# Patient Record
Sex: Male | Born: 1975 | Race: Black or African American | Hispanic: No | Marital: Single | Smoking: Current every day smoker
Health system: Southern US, Community
[De-identification: ages and names within clinical notes are randomized; demographics above are authoritative.]

## PROBLEM LIST (undated history)

## (undated) ENCOUNTER — Emergency Department (HOSPITAL_BASED_OUTPATIENT_CLINIC_OR_DEPARTMENT_OTHER): Source: Home / Self Care

## (undated) DIAGNOSIS — J45909 Unspecified asthma, uncomplicated: Secondary | ICD-10-CM

## (undated) HISTORY — PX: OTHER SURGICAL HISTORY: SHX169

---

## 1998-12-01 ENCOUNTER — Emergency Department (HOSPITAL_COMMUNITY): Admission: EM | Admit: 1998-12-01 | Discharge: 1998-12-01 | Payer: Self-pay | Admitting: Emergency Medicine

## 1998-12-11 ENCOUNTER — Encounter: Admission: RE | Admit: 1998-12-11 | Discharge: 1998-12-11 | Payer: Self-pay | Admitting: *Deleted

## 2008-10-21 ENCOUNTER — Emergency Department (HOSPITAL_COMMUNITY): Admission: EM | Admit: 2008-10-21 | Discharge: 2008-10-21 | Payer: Self-pay | Admitting: Emergency Medicine

## 2010-04-09 ENCOUNTER — Ambulatory Visit (HOSPITAL_COMMUNITY): Admission: RE | Admit: 2010-04-09 | Discharge: 2010-04-09 | Payer: Self-pay | Admitting: Psychiatry

## 2010-04-18 ENCOUNTER — Emergency Department (HOSPITAL_COMMUNITY): Admission: EM | Admit: 2010-04-18 | Discharge: 2010-04-18 | Payer: Self-pay | Admitting: Emergency Medicine

## 2010-11-19 LAB — URINALYSIS, ROUTINE W REFLEX MICROSCOPIC
Glucose, UA: NEGATIVE mg/dL
Hgb urine dipstick: NEGATIVE
Protein, ur: NEGATIVE mg/dL
Specific Gravity, Urine: 1.02 (ref 1.005–1.030)
Urobilinogen, UA: 0.2 mg/dL (ref 0.0–1.0)

## 2017-05-19 ENCOUNTER — Ambulatory Visit (HOSPITAL_COMMUNITY)
Admission: EM | Admit: 2017-05-19 | Discharge: 2017-05-19 | Disposition: A | Discharge status: Home / Self Care | Payer: BLUE CROSS/BLUE SHIELD | Attending: Family Medicine | Admitting: Family Medicine

## 2017-05-19 ENCOUNTER — Encounter (HOSPITAL_COMMUNITY): Payer: Self-pay | Admitting: Emergency Medicine

## 2017-05-19 DIAGNOSIS — J40 Bronchitis, not specified as acute or chronic: Secondary | ICD-10-CM

## 2017-05-19 DIAGNOSIS — J040 Acute laryngitis: Secondary | ICD-10-CM

## 2017-05-19 MED ORDER — METHYLPREDNISOLONE SODIUM SUCC 125 MG IJ SOLR
INTRAMUSCULAR | Status: AC
  Filled 2017-05-19: qty 2

## 2017-05-19 MED ORDER — AEROCHAMBER PLUS FLO-VU LARGE MISC: 1.0000 | Freq: Once | 0 refills | Status: AC

## 2017-05-19 MED ORDER — IPRATROPIUM-ALBUTEROL 0.5-2.5 (3) MG/3ML IN SOLN
3.0000 mL | Freq: Once | RESPIRATORY_TRACT | Status: AC
  Administered 2017-05-19: 3 mL via RESPIRATORY_TRACT

## 2017-05-19 MED ORDER — BENZONATATE 100 MG PO CAPS: 100.0000 mg | ORAL_CAPSULE | Freq: Three times a day (TID) | ORAL | 0 refills | Status: DC

## 2017-05-19 MED ORDER — PREDNISONE 10 MG (21) PO TBPK: ORAL_TABLET | Freq: Every day | ORAL | 0 refills | Status: DC

## 2017-05-19 MED ORDER — METHYLPREDNISOLONE SODIUM SUCC 125 MG IJ SOLR
125.0000 mg | Freq: Once | INTRAMUSCULAR | Status: AC
  Administered 2017-05-19: 125 mg via INTRAMUSCULAR

## 2017-05-19 MED ORDER — IPRATROPIUM-ALBUTEROL 0.5-2.5 (3) MG/3ML IN SOLN
RESPIRATORY_TRACT | Status: AC
  Filled 2017-05-19: qty 3

## 2017-05-19 MED ORDER — ALBUTEROL SULFATE HFA 108 (90 BASE) MCG/ACT IN AERS: 1.0000 | INHALATION_SPRAY | Freq: Four times a day (QID) | RESPIRATORY_TRACT | 0 refills | Status: DC | PRN

## 2017-05-19 MED ORDER — AZITHROMYCIN 250 MG PO TABS: 250.0000 mg | ORAL_TABLET | Freq: Every day | ORAL | 0 refills | Status: DC

## 2017-05-19 NOTE — ED Provider Notes (Signed)
[facility]    CSN: [id number] Arrival date & time: 05/19/17  1138     History   Chief Complaint Chief Complaint  Patient presents with  . URI    HPI Roy Sanchez is a 41 y.o. male.   41 year old male comes in for 1 week history of cough. He states he has had some shortness of breath/wheezing and associated chest pain during cough. He started experiencing hoarseness 2-3 days ago, stating he thinks it is due to cough and going in and out cold environments. Denies fever, chills, night sweats. Denies nasal congestion, rhinorrhea, eye pain. Has has some ear fullness. He has not tried anything for his symptoms. He is an every day smoker for 25 years, estimated 25 pack year history. He has tried to continue smoking, stating he just will not inhale. He has not needed to use inhalers in the past.       History reviewed. No pertinent past medical history.  There are no active problems to display for this patient.   History reviewed. No pertinent surgical history.     Home Medications    Prior to Admission medications   Medication Sig Start Date End Date Taking? Authorizing Provider  albuterol (PROVENTIL HFA;VENTOLIN HFA) 108 (90 Base) MCG/ACT inhaler Inhale 1-2 puffs into the lungs every 6 (six) hours as needed for wheezing or shortness of breath. 05/19/17   Cathie Hoops, Amy V, PA-C  azithromycin (ZITHROMAX) 250 MG tablet Take 1 tablet (250 mg total) by mouth daily. Take first 2 tablets together, then 1 every day until finished. 05/19/17   Cathie Hoops, Amy V, PA-C  benzonatate (TESSALON) 100 MG capsule Take 1 capsule (100 mg total) by mouth every 8 (eight) hours. 05/19/17   Cathie Hoops, Amy V, PA-C  predniSONE (STERAPRED UNI-PAK 21 TAB) 10 MG (21) TBPK tablet Take by mouth daily. Take 6 tabs by mouth day 1, then 5 tabs, then 4 tabs, then 3 tabs, 2 tabs, then 1 tab for the last day 05/19/17   Belinda Fisher, PA-C  Spacer/Aero-Holding Chambers (AEROCHAMBER PLUS FLO-VU LARGE) MISC 1 each by Other route once.  05/19/17 05/19/17  Belinda Fisher, PA-C    Family History History reviewed. No pertinent family history.  Social History Social History  Substance Use Topics  . Smoking status: Current Every Day Smoker    Packs/day: 1.00    Types: Cigarettes  . Smokeless tobacco: Never Used  . Alcohol use Yes     Allergies   Patient has no known allergies.   Review of Systems Review of Systems  Reason unable to perform ROS: See HPI as above.     Physical Exam Triage Vital Signs ED Triage Vitals [05/19/17 1305]  Enc Vitals Group     BP (!) 143/97     Pulse Rate 67     Resp 20     Temp 98.4 F (36.9 C)     Temp Source Oral     SpO2 96 %     Weight      Height      Head Circumference      Peak Flow      Pain Score      Pain Loc      Pain Edu?      Excl. in GC?    No data found.   Updated Vital Signs BP (!) 143/97 (BP Location: Left Arm)   Pulse 67   Temp 98.4 F (36.9 C) (Oral)   Resp 20  SpO2 96%    Physical Exam  Constitutional: He is oriented to person, place, and time. He appears well-developed and well-nourished. No distress.  HENT:  Head: Normocephalic and atraumatic.  Right Ear: Tympanic membrane, external ear and ear canal normal. Tympanic membrane is not erythematous and not bulging.  Left Ear: Tympanic membrane, external ear and ear canal normal. Tympanic membrane is not erythematous and not bulging.  Nose: Nose normal. Right sinus exhibits no maxillary sinus tenderness and no frontal sinus tenderness. Left sinus exhibits no maxillary sinus tenderness and no frontal sinus tenderness.  Mouth/Throat: Uvula is midline and mucous membranes are normal. Posterior oropharyngeal erythema present.  Eyes: Pupils are equal, round, and reactive to light. Conjunctivae are normal.  Neck: Normal range of motion. Neck supple.  Cardiovascular: Normal rate, regular rhythm and normal heart sounds.  Exam reveals no gallop and no friction rub.   No murmur heard. Pulmonary/Chest:  Effort normal.  Inspiratory and expiratory wheezing, decreased air movement.  Lymphadenopathy:    He has no cervical adenopathy.  Neurological: He is alert and oriented to person, place, and time.  Skin: Skin is warm and dry.  Psychiatric: He has a normal mood and affect. His behavior is normal. Judgment normal.     UC Treatments / Results  Labs (all labs ordered are listed, but only abnormal results are displayed) Labs Reviewed - No data to display  EKG  EKG Interpretation None       Radiology No results found.  Procedures Procedures (including critical care time)  Medications Ordered in UC Medications  ipratropium-albuterol (DUONEB) 0.5-2.5 (3) MG/3ML nebulizer solution 3 mL (3 mLs Nebulization Given 05/19/17 1350)  methylPREDNISolone sodium succinate (SOLU-MEDROL) 125 mg/2 mL injection 125 mg (125 mg Intramuscular Given 05/19/17 1350)     Initial Impression / Assessment and Plan / UC Course  I have reviewed the triage vital signs and the nursing notes.  Pertinent labs & imaging results that were available during my care of the patient were reviewed by me and considered in my medical decision making (see chart for details).    Improved wheezing after duoneb treatment. Given smoking history, will treat as COPD exacerbation. Start prednisone and azithromycin. Albuterol with spacer PRN. Other symptomatic treatment discussed. Encouraged smoking cessation. Patient to establish PCP for further treatment and evaluation needed. Return precautions given.   Final Clinical Impressions(s) / UC Diagnoses   Final diagnoses:  Bronchitis  Laryngitis    New Prescriptions New Prescriptions   ALBUTEROL (PROVENTIL HFA;VENTOLIN HFA) 108 (90 BASE) MCG/ACT INHALER    Inhale 1-2 puffs into the lungs every 6 (six) hours as needed for wheezing or shortness of breath.   AZITHROMYCIN (ZITHROMAX) 250 MG TABLET    Take 1 tablet (250 mg total) by mouth daily. Take first 2 tablets together, then  1 every day until finished.   BENZONATATE (TESSALON) 100 MG CAPSULE    Take 1 capsule (100 mg total) by mouth every 8 (eight) hours.   PREDNISONE (STERAPRED UNI-PAK 21 TAB) 10 MG (21) TBPK TABLET    Take by mouth daily. Take 6 tabs by mouth day 1, then 5 tabs, then 4 tabs, then 3 tabs, 2 tabs, then 1 tab for the last day   SPACER/AERO-HOLDING CHAMBERS (AEROCHAMBER PLUS FLO-VU LARGE) MISC    1 each by Other route once.       Belinda Fisher, PA-C 05/19/17 1414

## 2017-05-19 NOTE — ED Triage Notes (Signed)
Here for cold sx onset 1 week associated laryngitis and a dry cough  Smokes 1 PPD   A&O x4.... NAD... Ambulatory

## 2017-05-19 NOTE — Discharge Instructions (Signed)
Tessalon for cough. Start prednisone, azithromycin as directed. Albuterol inhaler with spacer for wheezing, I have attached inhaler instructions. You can use flonase, allergy medicine for nasal congestion. You can also use over the counter nasal saline rinse such as neti pot for nasal congestion. Keep hydrated, your urine should be clear to pale yellow in color. Tylenol/motrin for fever and pain. Encourage smoking cessation. Establish PCP care for further evaluation and treatment needed. Monitor for any worsening of symptoms, chest pain, shortness of breath, wheezing, swelling of the throat, follow up for reevaluation.

## 2017-05-27 ENCOUNTER — Emergency Department (HOSPITAL_COMMUNITY)
Admission: EM | Admit: 2017-05-27 | Discharge: 2017-05-28 | Disposition: A | Discharge status: Home / Self Care | Payer: BLUE CROSS/BLUE SHIELD | Attending: Emergency Medicine | Admitting: Emergency Medicine

## 2017-05-27 ENCOUNTER — Emergency Department (HOSPITAL_COMMUNITY): Payer: BLUE CROSS/BLUE SHIELD

## 2017-05-27 ENCOUNTER — Encounter (HOSPITAL_COMMUNITY): Payer: Self-pay | Admitting: Emergency Medicine

## 2017-05-27 DIAGNOSIS — B9789 Other viral agents as the cause of diseases classified elsewhere: Secondary | ICD-10-CM | POA: Insufficient documentation

## 2017-05-27 DIAGNOSIS — J45909 Unspecified asthma, uncomplicated: Secondary | ICD-10-CM | POA: Diagnosis not present

## 2017-05-27 DIAGNOSIS — J069 Acute upper respiratory infection, unspecified: Secondary | ICD-10-CM | POA: Insufficient documentation

## 2017-05-27 DIAGNOSIS — R05 Cough: Secondary | ICD-10-CM | POA: Diagnosis present

## 2017-05-27 DIAGNOSIS — F17210 Nicotine dependence, cigarettes, uncomplicated: Secondary | ICD-10-CM | POA: Insufficient documentation

## 2017-05-27 HISTORY — DX: Unspecified asthma, uncomplicated: J45.909

## 2017-05-27 LAB — CBC
HEMATOCRIT: 49.7 % (ref 39.0–52.0)
Hemoglobin: 16.6 g/dL (ref 13.0–17.0)
MCH: 29.1 pg (ref 26.0–34.0)
MCHC: 33.4 g/dL (ref 30.0–36.0)
MCV: 87.2 fL (ref 78.0–100.0)
Platelets: 193 10*3/uL (ref 150–400)
RBC: 5.7 MIL/uL (ref 4.22–5.81)
RDW: 13.8 % (ref 11.5–15.5)
WBC: 11.4 10*3/uL — ABNORMAL HIGH (ref 4.0–10.5)

## 2017-05-27 LAB — BASIC METABOLIC PANEL
Anion gap: 11 (ref 5–15)
BUN: 9 mg/dL (ref 6–20)
CHLORIDE: 102 mmol/L (ref 101–111)
CO2: 22 mmol/L (ref 22–32)
CREATININE: 0.96 mg/dL (ref 0.61–1.24)
Calcium: 9.3 mg/dL (ref 8.9–10.3)
GFR calc Af Amer: >60 mL/min (ref >60)
GFR calc non Af Amer: >60 mL/min (ref >60)
GLUCOSE: 152 mg/dL — AB (ref 65–99)
POTASSIUM: 3.8 mmol/L (ref 3.5–5.1)
Sodium: 135 mmol/L (ref 135–145)

## 2017-05-27 LAB — I-STAT TROPONIN, ED: Troponin i, poc: 0.01 ng/mL (ref 0.00–0.08)

## 2017-05-27 NOTE — ED Triage Notes (Signed)
Pt c/o chest pain and dry cough x 2 weeks, pain worse with coughing. Denies n/v/shortness of breath/diaphoresis.

## 2017-05-28 MED ORDER — SODIUM CHLORIDE 0.9 % IV BOLUS (SEPSIS)
1000.0000 mL | Freq: Once | INTRAVENOUS | Status: AC
  Administered 2017-05-28: 1000 mL via INTRAVENOUS

## 2017-05-28 NOTE — ED Provider Notes (Signed)
[facility] Note   CSN: [id number] Arrival date & time: 05/27/17  1833     History   Chief Complaint Chief Complaint  Patient presents with  . Chest Pain    HPI Roy Sanchez is a 41 y.o. male.  The history is provided by the patient.  he has been sick for about the last 10 days with cough. He went to urgent care 9 days ago and was treated with prednisone, azithromycin, benzonatate, and albuterol inhaler. He states he is somewhat better, but not as good as what he thinks he should be. There is vague discomfort in his central chest which is worse after eating. He is worried that he was coughing hard enough that he may have ruptured something. He denies fever, chills, sweats. He denies arthralgias or myalgias. There has been no nausea or vomiting. He initially had some dyspnea, but that has resolved. Today, he had one episode where he got dizzy when he stood up.  Past Medical History:  Diagnosis Date  . Asthma     There are no active problems to display for this patient.   Past Surgical History:  Procedure Laterality Date  . knee surgery         Home Medications    Prior to Admission medications   Medication Sig Start Date End Date Taking? Authorizing Provider  albuterol (PROVENTIL HFA;VENTOLIN HFA) 108 (90 Base) MCG/ACT inhaler Inhale 1-2 puffs into the lungs every 6 (six) hours as needed for wheezing or shortness of breath. 05/19/17   Cathie Hoops, Amy V, PA-C  azithromycin (ZITHROMAX) 250 MG tablet Take 1 tablet (250 mg total) by mouth daily. Take first 2 tablets together, then 1 every day until finished. 05/19/17   Cathie Hoops, Amy V, PA-C  benzonatate (TESSALON) 100 MG capsule Take 1 capsule (100 mg total) by mouth every 8 (eight) hours. 05/19/17   Cathie Hoops, Amy V, PA-C  predniSONE (STERAPRED UNI-PAK 21 TAB) 10 MG (21) TBPK tablet Take by mouth daily. Take 6 tabs by mouth day 1, then 5 tabs, then 4 tabs, then 3 tabs, 2 tabs, then 1 tab for the last day 05/19/17   Belinda Fisher, PA-C      Family History No family history on file.  Social History Social History  Substance Use Topics  . Smoking status: Current Every Day Smoker    Packs/day: 1.00    Types: Cigarettes  . Smokeless tobacco: Never Used  . Alcohol use Yes     Allergies   Patient has no known allergies.   Review of Systems Review of Systems  All other systems reviewed and are negative.    Physical Exam Updated Vital Signs BP 133/86 (BP Location: Left Arm)   Pulse (!) 105   Temp 98 F (36.7 C) (Oral)   Resp 16   SpO2 98%   Physical Exam  Nursing note and vitals reviewed.  41 year old male, resting comfortably and in no acute distress. Vital signs are significant for borderline tachycardia. Oxygen saturation is 98%, which is normal. Head is normocephalic and atraumatic. PERRLA, EOMI. Oropharynx is clear. Neck is nontender and supple without adenopathy or JVD. Back is nontender and there is no CVA tenderness. Lungs are clear without rales, wheezes, or rhonchi. Chest is nontender. Heart has regular rate and rhythm without murmur. Abdomen is soft, flat, nontender without masses or hepatosplenomegaly and peristalsis is normoactive. Extremities have no cyanosis or edema, full range of motion is present. Skin is warm and dry without  rash. Neurologic: Mental status is normal, cranial nerves are intact, there are no motor or sensory deficits.  ED Treatments / Results  Labs (all labs ordered are listed, but only abnormal results are displayed) Labs Reviewed  BASIC METABOLIC PANEL - Abnormal; Notable for the following:       Result Value   Glucose, Bld 152 (*)    All other components within normal limits  CBC - Abnormal; Notable for the following:    WBC 11.4 (*)    All other components within normal limits  I-STAT TROPONIN, ED  I-STAT TROPONIN, ED    EKG  EKG Interpretation  Date/Time:  Saturday May 27 2017 18:37:52 EDT Ventricular Rate:  100 PR Interval:  134 QRS  Duration: 92 QT Interval:  370 QTC Calculation: 477 R Axis:   76 Text Interpretation:  Normal sinus rhythm Minimal voltage criteria for LVH, may be normal variant T wave abnormality, consider inferolateral ischemia Prolonged QT Abnormal ECG No old tracing to compare Confirmed by Dione Booze ([id number]) on 05/27/2017 11:57:11 PM       Radiology Dg Chest 2 View  Result Date: 05/27/2017 CLINICAL DATA:  41 y/o  M; left-sided chest pain and cough. EXAM: CHEST  2 VIEW COMPARISON:  None. FINDINGS: The heart size and mediastinal contours are within normal limits. Both lungs are clear. The visualized skeletal structures are unremarkable. IMPRESSION: No active cardiopulmonary disease. Electronically Signed   By: Mitzi Hansen M.D.   On: 05/27/2017 19:34    Procedures Procedures (including critical care time)  Medications Ordered in ED Medications  sodium chloride 0.9 % bolus 1,000 mL (0 mLs Intravenous Stopped 05/28/17 0232)     Initial Impression / Assessment and Plan / ED Course  I have reviewed the triage vital signs and the nursing notes.  Pertinent labs & imaging results that were available during my care of the patient were reviewed by me and considered in my medical decision making (see chart for details).  Respiratory tract infection. Possible episode of orthostasis. In the ED, no orthostatic vital signs changes were noted. Old records reviewed confirming recent urgent care visit for respiratory tract infection. Laboratory evaluation is significant for glucose 152. He does have a plain sister who has diabetes, so we'll need to be watched for this closely. Chest x-ray shows no acute process. ECG is borderline, but symptoms are not suggestive of ACS. We'll check repeat troponin. He will be given IV hydration and reassessed.  He feels much better after above noted treatment. At this point, I do not feel he needs any new medications. He does need to have his blood glucose rechecked in  the near future. Importance of following up with this is explained to patient, as well as the importance of smoking cessation. He is given resources to try to establish primary care.  Final Clinical Impressions(s) / ED Diagnoses   Final diagnoses:  Viral upper respiratory tract infection with cough    New Prescriptions New Prescriptions   No medications on file     Dione Booze, MD 05/28/17 [phone]

## 2017-05-28 NOTE — Discharge Instructions (Signed)
Your blood sugar today was a little high - 152. This needs to be repeated in the next 1-2 weeks. If it stays high, it would mean that you are developing diabetes. However, one of the medications you were prescribed last week can cause your blood sugar to go up. Even if your blood sugar goes back to normal, you should have it checked periodically.  Please stop smoking.  You need to get a primary care provider who can follow your blood sugars, and do routine health screening.

## 2018-04-27 IMAGING — DX DG CHEST 2V
2 series · 2 of 2 positions shown · non-contrast
Comparison: None.

CLINICAL DATA: 41 y/o  M; left-sided chest pain and cough.

EXAM:
CHEST  2 VIEW

[chest pa]
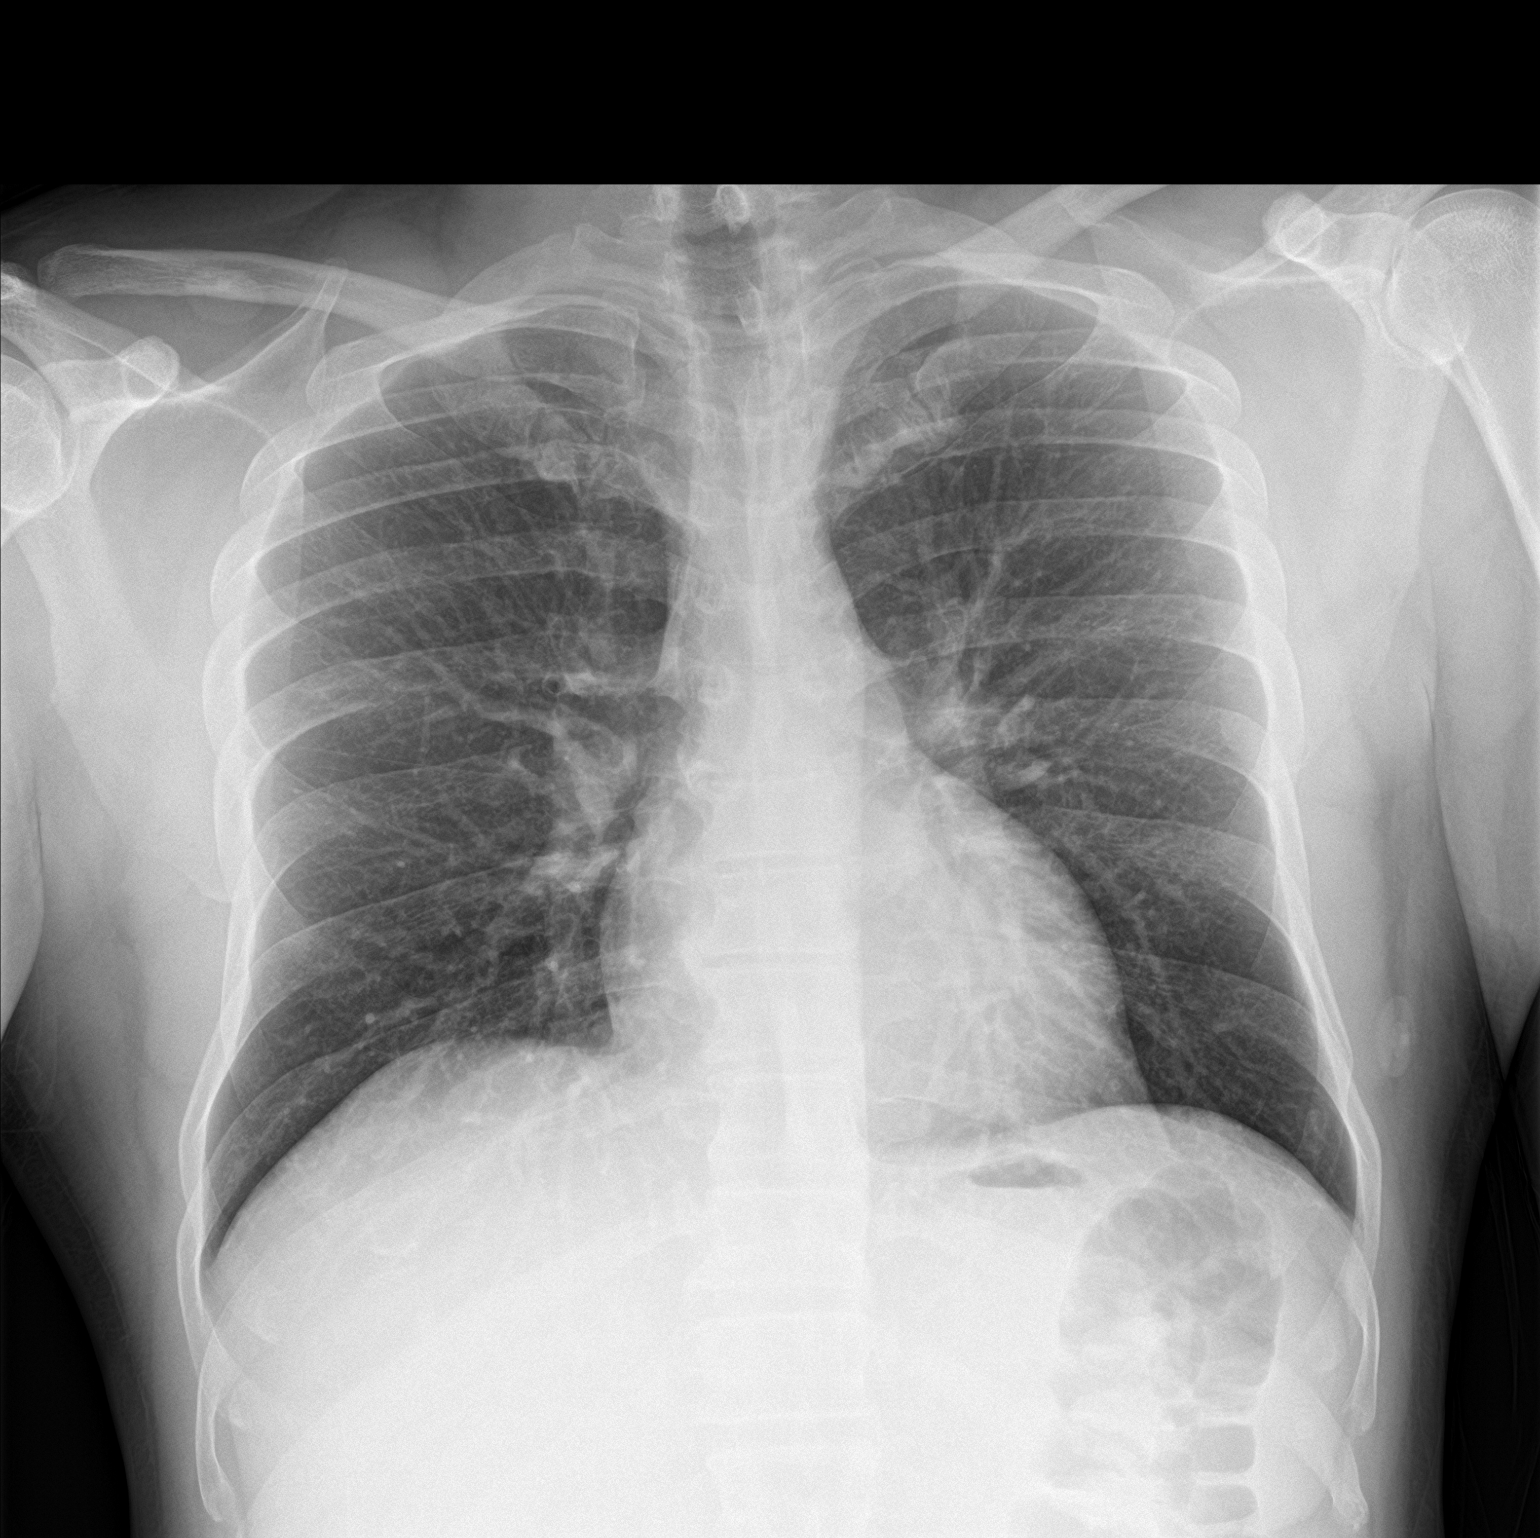

[chest lat]
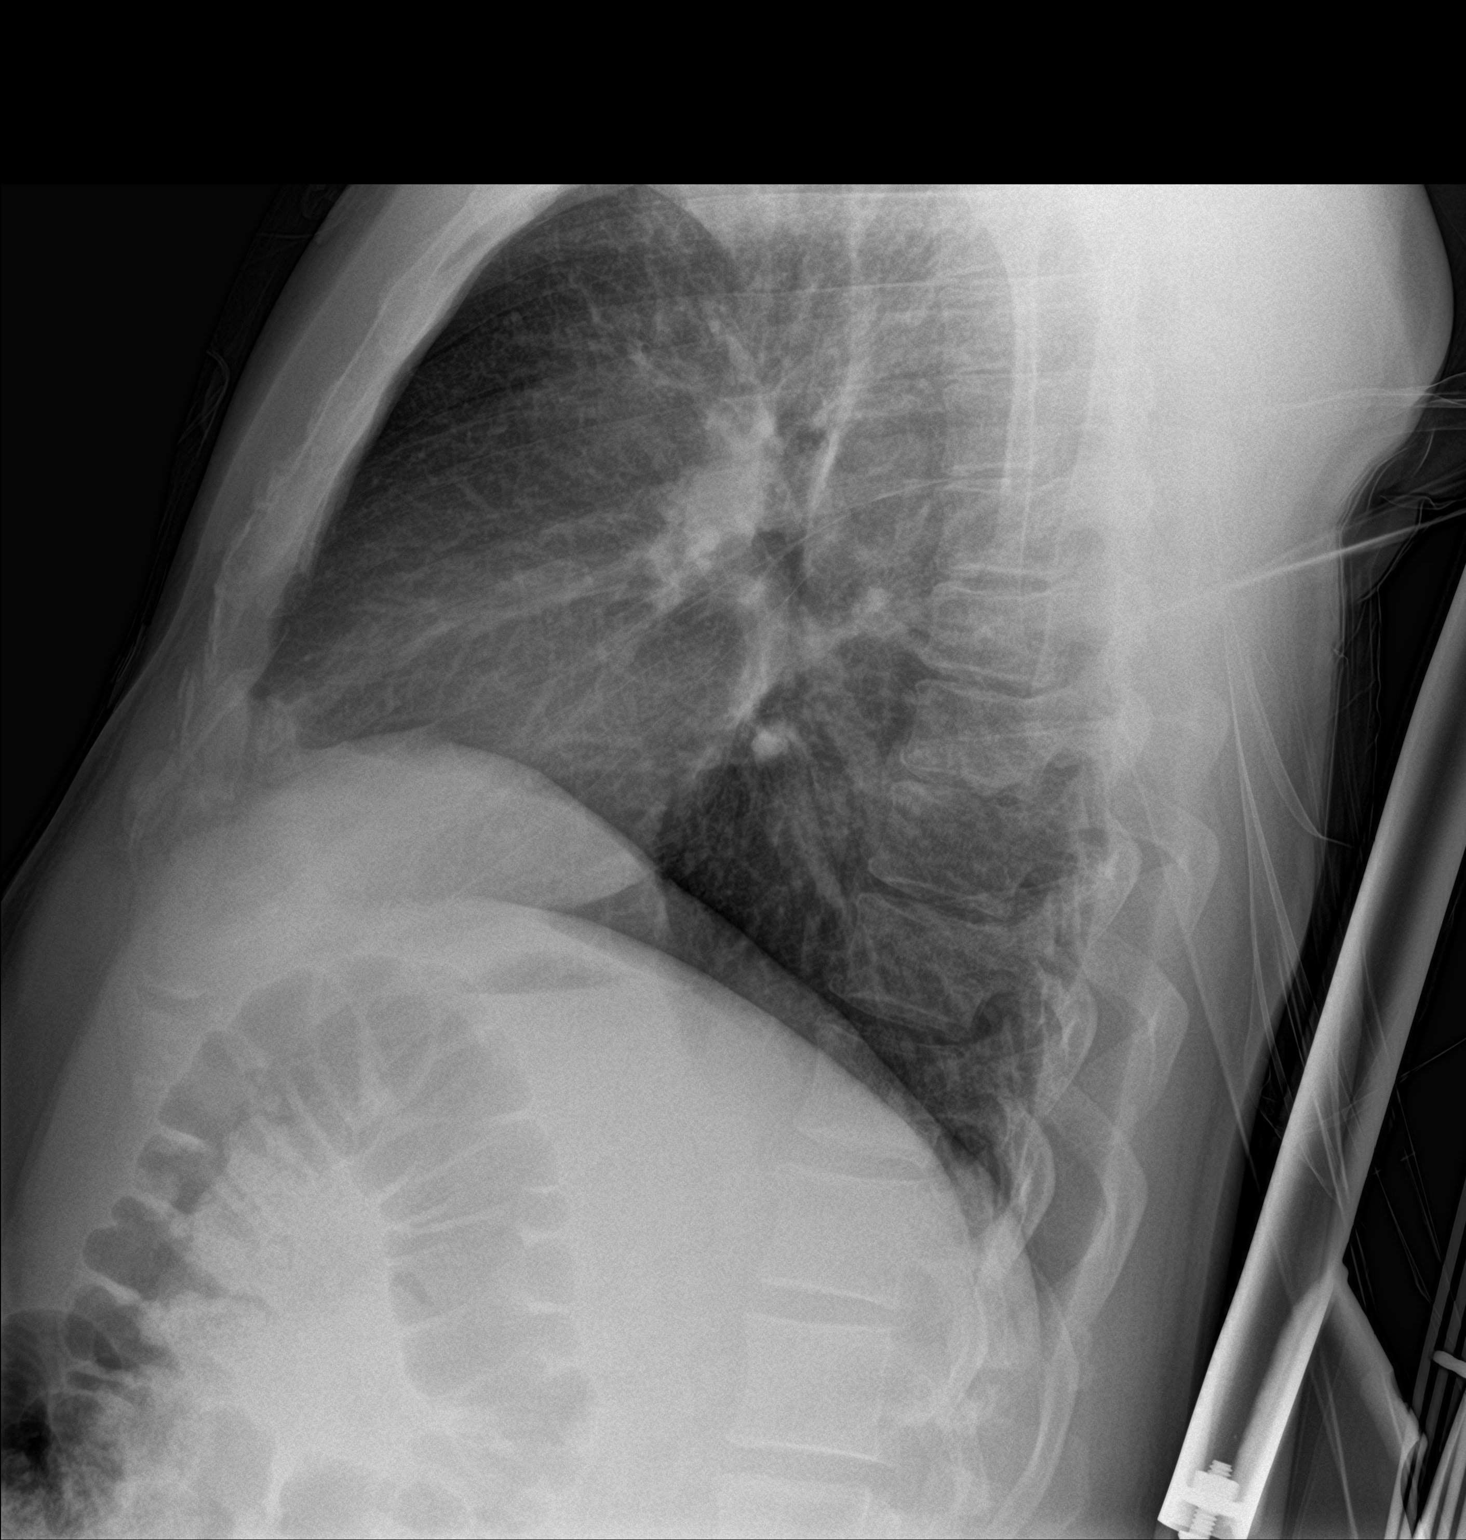

[2 of 2 positions shown; findings below may reference images not displayed]

FINDINGS: The heart size and mediastinal contours are within normal limits.
Both lungs are clear. The visualized skeletal structures are
unremarkable.
IMPRESSION: No active cardiopulmonary disease.

By: Osniel Socha M.D.

## 2021-03-22 ENCOUNTER — Other Ambulatory Visit: Payer: Self-pay

## 2021-03-22 ENCOUNTER — Ambulatory Visit (HOSPITAL_COMMUNITY)
Admission: EM | Admit: 2021-03-22 | Discharge: 2021-03-22 | Disposition: A | Discharge status: Home / Self Care | Payer: BC Managed Care – PPO | Attending: Emergency Medicine | Admitting: Emergency Medicine

## 2021-03-22 ENCOUNTER — Encounter (HOSPITAL_COMMUNITY): Payer: Self-pay

## 2021-03-22 DIAGNOSIS — H60393 Other infective otitis externa, bilateral: Secondary | ICD-10-CM | POA: Diagnosis not present

## 2021-03-22 MED ORDER — HYDROCORTISONE-ACETIC ACID 1-2 % OT SOLN: 3.0000 [drp] | Freq: Four times a day (QID) | OTIC | 0 refills | Status: DC

## 2021-03-22 MED ORDER — FLUTICASONE PROPIONATE 50 MCG/ACT NA SUSP: 1.0000 | Freq: Every day | NASAL | 2 refills | Status: DC

## 2021-03-22 NOTE — ED Triage Notes (Signed)
Pt presents with bilateral ear fullness X 3 days. 

## 2021-03-22 NOTE — ED Provider Notes (Signed)
[facility]    CSN: [id number] Arrival date & time: 03/22/21  1421      History   Chief Complaint Chief Complaint  Patient presents with   Ear Fullness    HPI Roy Sanchez is a 45 y.o. male.   Patient presents with bilateral ear fullness and decreased hearing for three days. Denies pain, itching, discharge, odor, fever, chills, URI symptoms. Denies recent trauma.   Past Medical History:  Diagnosis Date   Asthma     There are no problems to display for this patient.   Past Surgical History:  Procedure Laterality Date   knee surgery         Home Medications    Prior to Admission medications   Medication Sig Start Date End Date Taking? Authorizing Provider  acetic acid-hydrocortisone (VOSOL-HC) OTIC solution Place 3 drops into both ears 4 (four) times daily. 03/22/21  Yes Ronell Boldin R, NP  fluticasone (FLONASE) 50 MCG/ACT nasal spray Place 1 spray into both nostrils daily. 03/22/21  Yes Valinda Hoar, NP    Family History History reviewed. No pertinent family history.  Social History Social History   Tobacco Use   Smoking status: Every Day    Packs/day: 1.00    Types: Cigarettes   Smokeless tobacco: Never  Substance Use Topics   Alcohol use: Yes   Drug use: No     Allergies   Patient has no known allergies.   Review of Systems Review of Systems  Constitutional: Negative.   HENT: Negative.    Eyes: Negative.   Skin: Negative.     Physical Exam Triage Vital Signs ED Triage Vitals  Enc Vitals Group     BP 03/22/21 1616 129/85     Pulse Rate 03/22/21 1616 80     Resp 03/22/21 1616 18     Temp 03/22/21 1616 98.1 F (36.7 C)     Temp Source 03/22/21 1616 Oral     SpO2 03/22/21 1616 98 %     Weight --      Height --      Head Circumference --      Peak Flow --      Pain Score 03/22/21 1617 0     Pain Loc --      Pain Edu? --      Excl. in GC? --    No data found.  Updated Vital Signs BP 129/85 (BP Location:  Left Arm)   Pulse 80   Temp 98.1 F (36.7 C) (Oral)   Resp 18   SpO2 98%   Visual Acuity Right Eye Distance:   Left Eye Distance:   Bilateral Distance:    Right Eye Near:   Left Eye Near:    Bilateral Near:     Physical Exam Constitutional:      Appearance: Normal appearance. He is normal weight.  HENT:     Head: Normocephalic.     Right Ear: Decreased hearing noted. A middle ear effusion is present.     Left Ear: Decreased hearing noted. A middle ear effusion is present.     Ears:      Comments: Erythema in bilateral ear canals  Neurological:     Mental Status: He is alert.     UC Treatments / Results  Labs (all labs ordered are listed, but only abnormal results are displayed) Labs Reviewed - No data to display  EKG   Radiology No results found.  Procedures Procedures (including critical care  time)  Medications Ordered in UC Medications - No data to display  Initial Impression / Assessment and Plan / UC Course  I have reviewed the triage vital signs and the nursing notes.  Pertinent labs & imaging results that were available during my care of the patient were reviewed by me and considered in my medical decision making (see chart for details).  Bilateral otitis externa  Acetic acid-hydrocortisone 3 drops each ear every 6 hours or 7 days Flonase 50 mcg/ml 1 spray each nare daily Follow up if symptoms persist after two weeks of medication usage or at any point if symptoms worsen  Final Clinical Impressions(s) / UC Diagnoses   Final diagnoses:  Infective otitis externa of both ears     Discharge Instructions      Place cotton ball into ear, keep moistened by adding 3 drops to each every 6 hours for 7 days, can change cotton ball daily, continue medication an additional week if symptoms still present  Follow up if symptoms persist after two weeks of medication use, follow up at any point if symptoms worsen  Can use flonase daily each morning, point  tip outwards once in nare      ED Prescriptions     Medication Sig Dispense Auth. Provider   acetic acid-hydrocortisone (VOSOL-HC) OTIC solution Place 3 drops into both ears 4 (four) times daily. 10 mL Kerolos Nehme R, NP   fluticasone (FLONASE) 50 MCG/ACT nasal spray Place 1 spray into both nostrils daily. 11.1 mL Valinda Hoar, NP      PDMP not reviewed this encounter.   Valinda Hoar, NP 03/22/21 1654

## 2021-03-22 NOTE — Discharge Instructions (Addendum)
Place cotton ball into ear, keep moistened by adding 3 drops to each every 6 hours for 7 days, can change cotton ball daily, continue medication an additional week if symptoms still present  Follow up if symptoms persist after two weeks of medication use, follow up at any point if symptoms worsen  Can use flonase daily each morning, point tip outwards once in nare

## 2021-08-01 ENCOUNTER — Emergency Department (HOSPITAL_COMMUNITY): Payer: BC Managed Care – PPO

## 2021-08-01 ENCOUNTER — Other Ambulatory Visit: Payer: Self-pay

## 2021-08-01 ENCOUNTER — Emergency Department (HOSPITAL_COMMUNITY)
Admission: EM | Admit: 2021-08-01 | Discharge: 2021-08-01 | Disposition: A | Discharge status: Home / Self Care | Payer: BC Managed Care – PPO | Attending: Emergency Medicine | Admitting: Emergency Medicine

## 2021-08-01 ENCOUNTER — Encounter (HOSPITAL_COMMUNITY): Payer: Self-pay | Admitting: Emergency Medicine

## 2021-08-01 DIAGNOSIS — H9201 Otalgia, right ear: Secondary | ICD-10-CM | POA: Insufficient documentation

## 2021-08-01 DIAGNOSIS — Z20822 Contact with and (suspected) exposure to covid-19: Secondary | ICD-10-CM | POA: Insufficient documentation

## 2021-08-01 DIAGNOSIS — R000 Tachycardia, unspecified: Secondary | ICD-10-CM | POA: Insufficient documentation

## 2021-08-01 DIAGNOSIS — F17210 Nicotine dependence, cigarettes, uncomplicated: Secondary | ICD-10-CM | POA: Diagnosis not present

## 2021-08-01 DIAGNOSIS — R070 Pain in throat: Secondary | ICD-10-CM | POA: Diagnosis present

## 2021-08-01 DIAGNOSIS — J039 Acute tonsillitis, unspecified: Secondary | ICD-10-CM | POA: Insufficient documentation

## 2021-08-01 DIAGNOSIS — J45909 Unspecified asthma, uncomplicated: Secondary | ICD-10-CM | POA: Diagnosis not present

## 2021-08-01 DIAGNOSIS — J038 Acute tonsillitis due to other specified organisms: Secondary | ICD-10-CM

## 2021-08-01 DIAGNOSIS — J029 Acute pharyngitis, unspecified: Secondary | ICD-10-CM

## 2021-08-01 LAB — CBC WITH DIFFERENTIAL/PLATELET
Abs Immature Granulocytes: 0.21 10*3/uL — ABNORMAL HIGH (ref 0.00–0.07)
Basophils Absolute: 0.1 10*3/uL (ref 0.0–0.1)
Basophils Relative: 0 %
Eosinophils Absolute: 0.1 10*3/uL (ref 0.0–0.5)
Eosinophils Relative: 0 %
HCT: 46.6 % (ref 39.0–52.0)
Hemoglobin: 15.4 g/dL (ref 13.0–17.0)
Immature Granulocytes: 1 %
Lymphocytes Relative: 7 %
Lymphs Abs: 1.3 10*3/uL (ref 0.7–4.0)
MCH: 28.9 pg (ref 26.0–34.0)
MCHC: 33 g/dL (ref 30.0–36.0)
MCV: 87.6 fL (ref 80.0–100.0)
Monocytes Absolute: 1.4 10*3/uL — ABNORMAL HIGH (ref 0.1–1.0)
Monocytes Relative: 8 %
Neutro Abs: 15.4 10*3/uL — ABNORMAL HIGH (ref 1.7–7.7)
Neutrophils Relative %: 84 %
Platelets: 137 10*3/uL — ABNORMAL LOW (ref 150–400)
RBC: 5.32 MIL/uL (ref 4.22–5.81)
RDW: 13.9 % (ref 11.5–15.5)
WBC: 18.4 10*3/uL — ABNORMAL HIGH (ref 4.0–10.5)
nRBC: 0 % (ref 0.0–0.2)

## 2021-08-01 LAB — LACTIC ACID, PLASMA
Lactic Acid, Venous: 1.2 mmol/L (ref 0.5–1.9)
Lactic Acid, Venous: 0.8 mmol/L (ref 0.5–1.9)

## 2021-08-01 LAB — COMPREHENSIVE METABOLIC PANEL
ALT: 33 U/L (ref 0–44)
AST: 26 U/L (ref 15–41)
Albumin: 3.9 g/dL (ref 3.5–5.0)
Alkaline Phosphatase: 64 U/L (ref 38–126)
Anion gap: 10 (ref 5–15)
BUN: 11 mg/dL (ref 6–20)
CO2: 23 mmol/L (ref 22–32)
Calcium: 9.4 mg/dL (ref 8.9–10.3)
Chloride: 101 mmol/L (ref 98–111)
Creatinine, Ser: 0.91 mg/dL (ref 0.61–1.24)
GFR, Estimated: >60 mL/min (ref >60)
Glucose, Bld: 139 mg/dL — ABNORMAL HIGH (ref 70–99)
Potassium: 3.9 mmol/L (ref 3.5–5.1)
Sodium: 134 mmol/L — ABNORMAL LOW (ref 135–145)
Total Bilirubin: 0.7 mg/dL (ref 0.3–1.2)
Total Protein: 7.6 g/dL (ref 6.5–8.1)

## 2021-08-01 LAB — RESP PANEL BY RT-PCR (FLU A&B, COVID) ARPGX2
Influenza A by PCR: NEGATIVE
Influenza B by PCR: NEGATIVE
SARS Coronavirus 2 by RT PCR: NEGATIVE

## 2021-08-01 LAB — GROUP A STREP BY PCR: Group A Strep by PCR: DETECTED — AB

## 2021-08-01 MED ORDER — ACETAMINOPHEN 325 MG PO TABS
650.0000 mg | ORAL_TABLET | Freq: Once | ORAL | Status: AC | PRN
  Administered 2021-08-01: 12:00:00 650 mg via ORAL
  Filled 2021-08-01: qty 2

## 2021-08-01 MED ORDER — IOHEXOL 350 MG/ML SOLN
100.0000 mL | Freq: Once | INTRAVENOUS | Status: AC | PRN
  Administered 2021-08-01: 14:00:00 100 mL via INTRAVENOUS

## 2021-08-01 MED ORDER — SODIUM CHLORIDE 0.9 % IV SOLN
3.0000 g | Freq: Once | INTRAVENOUS | Status: AC
  Administered 2021-08-01: 12:00:00 3 g via INTRAVENOUS
  Filled 2021-08-01: qty 8

## 2021-08-01 MED ORDER — CLINDAMYCIN HCL 150 MG PO CAPS: 300.0000 mg | ORAL_CAPSULE | Freq: Four times a day (QID) | ORAL | 0 refills | Status: AC

## 2021-08-01 MED ORDER — METHYLPREDNISOLONE SODIUM SUCC 125 MG IJ SOLR
125.0000 mg | Freq: Once | INTRAMUSCULAR | Status: AC
  Administered 2021-08-01: 12:00:00 125 mg via INTRAVENOUS
  Filled 2021-08-01: qty 2

## 2021-08-01 MED ORDER — DEXAMETHASONE SODIUM PHOSPHATE 10 MG/ML IJ SOLN
10.0000 mg | Freq: Once | INTRAMUSCULAR | Status: AC
  Administered 2021-08-01: 14:00:00 10 mg via INTRAVENOUS
  Filled 2021-08-01: qty 1

## 2021-08-01 MED ORDER — KETOROLAC TROMETHAMINE 15 MG/ML IJ SOLN
15.0000 mg | Freq: Once | INTRAMUSCULAR | Status: AC
  Administered 2021-08-01: 12:00:00 15 mg via INTRAVENOUS
  Filled 2021-08-01: qty 1

## 2021-08-01 MED ORDER — LACTATED RINGERS IV BOLUS
1000.0000 mL | Freq: Once | INTRAVENOUS | Status: AC
  Administered 2021-08-01: 12:00:00 1000 mL via INTRAVENOUS

## 2021-08-01 NOTE — ED Triage Notes (Addendum)
C/o R ear pain, congestion, and sore throat x 3 days and states it feels like throat is swelling.  Voice muffled.

## 2021-08-01 NOTE — ED Provider Notes (Signed)
[facility] EMERGENCY DEPARTMENT Provider Note   CSN: [id number] Arrival date & time: 08/01/21  1122     History No chief complaint on file.   Roy Sanchez is a 45 y.o. male.  HPI Patient presents for 3 days of throat pain and swelling.  He initially had right ear pain.  He feels that this is from cutting his hairs and his hairs and up in his ear and cause pain.  Over the past 3 days, this pain is migrated to the right side of his throat.  Currently, he has pain on both sides of his throat that is worse on the right side.  He has had myalgias, fevers, chills, and headache pain.  Headache is also on the right side.  He denies any history of throat problems.  He has been able to swallow, including Tylenol tablet today.  He has been waking up frequently, when he feels that he stops breathing.    Past Medical History:  Diagnosis Date   Asthma     There are no problems to display for this patient.   Past Surgical History:  Procedure Laterality Date   knee surgery         No family history on file.  Social History   Tobacco Use   Smoking status: Every Day    Packs/day: 1.00    Types: Cigarettes   Smokeless tobacco: Never  Substance Use Topics   Alcohol use: Yes   Drug use: No    Home Medications Prior to Admission medications   Medication Sig Start Date End Date Taking? Authorizing Provider  acetaminophen (TYLENOL) 500 MG tablet Take 1,000 mg by mouth every 6 (six) hours as needed for mild pain.   Yes [provider]  clindamycin (CLEOCIN) 150 MG capsule Take 2 capsules (300 mg total) by mouth 4 (four) times daily for 10 days. 08/01/21 08/11/21 Yes Gloris Manchester, MD    Allergies    Patient has no known allergies.  Review of Systems   Review of Systems  Constitutional:  Positive for activity change, appetite change, chills, fatigue and fever.  HENT:  Positive for ear pain, sore throat, trouble swallowing and voice change. Negative for  dental problem, facial swelling and mouth sores.   Eyes:  Negative for pain and visual disturbance.  Respiratory:  Negative for cough, chest tightness, shortness of breath, wheezing and stridor.   Cardiovascular:  Negative for chest pain and palpitations.  Gastrointestinal:  Negative for abdominal pain, diarrhea, nausea and vomiting.  Genitourinary:  Negative for dysuria, flank pain and hematuria.  Musculoskeletal:  Negative for arthralgias, back pain, gait problem, joint swelling and myalgias.  Skin:  Negative for color change and rash.  Allergic/Immunologic: Negative for immunocompromised state.  Neurological:  Positive for headaches. Negative for dizziness, seizures, syncope, weakness, light-headedness and numbness.  Hematological:  Does not bruise/bleed easily.  Psychiatric/Behavioral:  Negative for confusion and decreased concentration.   All other systems reviewed and are negative.  Physical Exam Updated Vital Signs BP 125/77   Pulse 84   Temp 98.1 F (36.7 C) (Oral)   Resp 12   SpO2 98%   Physical Exam Vitals and nursing note reviewed.  Constitutional:      General: He is not in acute distress.    Appearance: Normal appearance. He is well-developed and normal weight. He is not ill-appearing, toxic-appearing or diaphoretic.  HENT:     Head: Normocephalic and atraumatic.     Right Ear: Tympanic membrane,  ear canal and external ear normal.     Left Ear: Tympanic membrane, ear canal and external ear normal.     Ears:     Comments: Cut hairs present in right EAC    Nose: Nose normal. No congestion.     Mouth/Throat:     Mouth: Mucous membranes are moist. No injury.     Tongue: Tongue does not deviate from midline.     Palate: No lesions.     Pharynx: Pharyngeal swelling, posterior oropharyngeal erythema and uvula swelling present.     Tonsils: No tonsillar exudate. 4+ on the right. 4+ on the left.  Eyes:     Conjunctiva/sclera: Conjunctivae normal.  Cardiovascular:      Rate and Rhythm: Regular rhythm. Tachycardia present.     Heart sounds: No murmur heard. Pulmonary:     Effort: Pulmonary effort is normal. No respiratory distress.     Breath sounds: Normal breath sounds. No stridor. No wheezing or rales.  Abdominal:     General: Abdomen is flat.     Palpations: Abdomen is soft.     Tenderness: There is no abdominal tenderness.  Musculoskeletal:        General: No swelling.     Cervical back: Neck supple.     Right lower leg: No edema.     Left lower leg: No edema.  Skin:    General: Skin is warm and dry.     Capillary Refill: Capillary refill takes less than 2 seconds.     Coloration: Skin is not jaundiced or pale.  Neurological:     General: No focal deficit present.     Mental Status: He is alert and oriented to person, place, and time.     Cranial Nerves: No cranial nerve deficit.     Sensory: No sensory deficit.     Motor: No weakness.  Psychiatric:        Mood and Affect: Mood normal.        Behavior: Behavior normal.        Thought Content: Thought content normal.        Judgment: Judgment normal.    ED Results / Procedures / Treatments   Labs (all labs ordered are listed, but only abnormal results are displayed) Labs Reviewed  GROUP A STREP BY PCR - Abnormal; Notable for the following components:      Result Value   Group A Strep by PCR DETECTED (*)    All other components within normal limits  COMPREHENSIVE METABOLIC PANEL - Abnormal; Notable for the following components:   Sodium 134 (*)    Glucose, Bld 139 (*)    All other components within normal limits  CBC WITH DIFFERENTIAL/PLATELET - Abnormal; Notable for the following components:   WBC 18.4 (*)    Platelets 137 (*)    Neutro Abs 15.4 (*)    Monocytes Absolute 1.4 (*)    Abs Immature Granulocytes 0.21 (*)    All other components within normal limits  RESP PANEL BY RT-PCR (FLU A&B, COVID) ARPGX2  CULTURE, BLOOD (ROUTINE X 2)  CULTURE, BLOOD (ROUTINE X 2)  LACTIC  ACID, PLASMA  LACTIC ACID, PLASMA    EKG None  Radiology CT Soft Tissue Neck W Contrast  Result Date: 08/01/2021 CLINICAL DATA:  Epiglottitis or tonsillitis suspected. Peritonsillar abscess. Additional history provided: EXAM: CT NECK WITH CONTRAST TECHNIQUE: Multidetector CT imaging of the neck was performed using the standard protocol following the bolus administration of intravenous contrast. CONTRAST:  166mL OMNIPAQUE IOHEXOL 350 MG/ML SOLN COMPARISON:  No pertinent prior exams available for comparison. FINDINGS: Pharynx and larynx: Streak and beam hardening artifact arising from dental restoration partially obscures the oral cavity. There is marked prominence of the bilateral adenoid soft tissues and palatine tonsils. No peritonsillar abscess is identified. Mild to moderate oropharyngeal airway effacement. The larynx is unremarkable. Unremarkable appearance of the epiglottis. No retropharyngeal collection. Salivary glands: No inflammation, mass, or stone. Thyroid: Unremarkable. Lymph nodes: Bilateral level 2 and 3 lymphadenopathy with lymph nodes measuring up to 16 mm in short axis. Vascular: The major vascular structures of the neck are patent. Limited intracranial: No evidence of acute intracranial abnormality within the field of view. Visualized orbits: No acute or significant orbital finding. Mastoids and visualized paranasal sinuses: Mild mucosal thickening within the right frontal, bilateral ethmoid, right sphenoid and bilateral maxillary sinuses. No significant mastoid effusion. Skeleton: Cervical spondylosis. No acute bony abnormality or aggressive osseous lesion. Upper chest: No consolidation within the imaged lung apices. IMPRESSION: Marked prominence of the bilateral adenoid/nasopharyngeal soft tissues and palatine tonsils. These findings may reflect acute tonsillitis/pharyngitis in the appropriate clinical setting. However, potential alternative etiologies include tonsillar/pharyngeal mass  or a lymphoproliferative process such as lymphoma. Clinical correlation is recommended. Additionally, close clinical follow-up is recommended (with imaging follow-up as warranted) to ensure resolution. If there is not a prompt response to therapy, ENT consultation and direct tissue sampling should be considered. Mild-to-moderate oropharyngeal airway effacement. No peritonsillar abscess is identified. Bilateral level 2 and 3 lymphadenopathy, which may be reactive. However, close clinical follow-up is recommended (with imaging follow-up as warranted) to ensure resolution and exclude alternative etiologies (such as nodal metastatic disease or a lymphoproliferative process). Paranasal sinus disease, as described. Electronically Signed   By: Kellie Simmering D.O.   On: 08/01/2021 14:47    Procedures Procedures   Medications Ordered in ED Medications  acetaminophen (TYLENOL) tablet 650 mg (650 mg Oral Given 08/01/21 1139)  methylPREDNISolone sodium succinate (SOLU-MEDROL) 125 mg/2 mL injection 125 mg (125 mg Intravenous Given 08/01/21 1220)  Ampicillin-Sulbactam (UNASYN) 3 g in sodium chloride 0.9 % 100 mL IVPB (0 g Intravenous Stopped 08/01/21 1253)  lactated ringers bolus 1,000 mL (0 mLs Intravenous Stopped 08/01/21 1325)  ketorolac (TORADOL) 15 MG/ML injection 15 mg (15 mg Intravenous Given 08/01/21 1220)  dexamethasone (DECADRON) injection 10 mg (10 mg Intravenous Given 08/01/21 1338)  iohexol (OMNIPAQUE) 350 MG/ML injection 100 mL (100 mLs Intravenous Contrast Given 08/01/21 1417)    ED Course  I have reviewed the triage vital signs and the nursing notes.  Pertinent labs & imaging results that were available during my care of the patient were reviewed by me and considered in my medical decision making (see chart for details).    MDM Rules/Calculators/A&P                          Patient presents for throat pain and swelling.  Symptoms have progressed over the past 3 days.  He has no history of  the same.  On arrival, patient is febrile to 100.8 degrees.  Vital signs also notable for tachycardia.  On exam, his breathing is unlabored.  He has no stridor.  He does have a muffled voice.  On inspection of oropharynx, he has significant tonsillar and uvular edema.  Uvula does appear to be midline.  Patient was given steroids and antibiotics.  He was able to swallow Tylenol.  Lab work showed a leukocytosis,  consistent with acute bacterial infection.  Patient was able to undergo CT scanning of his neck area.  Results showed no drainable abscess.  He does have markedly swollen adenoids and tonsils.  I did consult ENT, Dr. Benjamine Mola, early on.  Dr. Benjamine Mola recommended a 10 mg dose of Decadron in addition to the Solu-Medrol he had already received.  He does feel that patient will improve following steroids and that he will be appropriate for follow-up in clinic.  He recommended prescription for clindamycin upon discharge. Dr. Benjamine Mola also came and evaluated the patient at bedside.  On reassessment, patient did endorse improved symptoms of throat pain and swelling.  Breathing remained unlabored.  His muffled voice did slightly improve during his observation time in the ED.  Patient was given strict return precautions in the event that he experiences any worsening of his swelling or pain.  He was prescribed clindamycin.  He was advised to follow-up with ENT as soon as possible.  He was discharged in stable condition.  Final Clinical Impression(s) / ED Diagnoses Final diagnoses:  Acute bacterial tonsillitis  Pharyngitis, unspecified etiology    Rx / DC Orders ED Discharge Orders          Ordered    clindamycin (CLEOCIN) 150 MG capsule  4 times daily        08/01/21 1609             Godfrey Pick, MD 08/03/21 0500

## 2021-08-01 NOTE — ED Provider Notes (Signed)
Emergency Medicine Provider Triage Evaluation Note  Roy Sanchez , a 45 y.o. male  was evaluated in triage.  Pt complains of cold sxs.  Review of Systems  Positive: Sore throat, ear pain, congestion, muffled voice Negative: Cough, cp, sob, abd pain  Physical Exam  BP (!) 150/88 (BP Location: Right Arm)   Pulse (!) 119   Temp (!) 100.8 F (38.2 C) (Oral)   Resp (!) 22   SpO2 96%  Gen:   Awake, uncomfortable Resp:  Normal effort  MSK:   Moves extremities without difficulty  Other:  Evidence of R PTA on throat exam  Medical Decision Making  Medically screening exam initiated at 11:39 AM.  Appropriate orders placed.  Jakaleb Twiggs was informed that the remainder of the evaluation will be completed by another provider, this initial triage assessment does not replace that evaluation, and the importance of remaining in the ED until their evaluation is complete.  Pt here with sore throat, trouble swallowing, drooling, febrile. Initial exam concerning for R peritonsillar abscess.  Likely benefiting imaging, abx and steroid.    Fayrene Helper, PA-C 08/01/21 1146    Terald Sleeper, MD 08/01/21 1253

## 2021-08-01 NOTE — ED Notes (Signed)
ENT at bedside

## 2021-08-06 LAB — CULTURE, BLOOD (ROUTINE X 2)
Culture: NO GROWTH
Culture: NO GROWTH
Special Requests: ADEQUATE

## 2022-07-02 IMAGING — CT CT NECK W/ CM
4 of 5 series · 14 of 33 positions shown, 16 images · IV contrast (omnipaque)
Comparison: No pertinent prior exams available for comparison.

CLINICAL DATA: Epiglottitis or tonsillitis suspected. Peritonsillar
abscess. Additional history provided:

EXAM:
CT NECK WITH CONTRAST
TECHNIQUE: Multidetector CT imaging of the neck was performed using the
standard protocol following the bolus administration of intravenous
contrast.
CONTRAST:  100mL OMNIPAQUE IOHEXOL 350 MG/ML SOLN

[Series 3: neck 2.0 i31s 3 · axial · 0.52mm/px · z∈[-212,-34]mm · 4 of 149 slices shown, 5 images]
[im 30/149  soft-tissue]
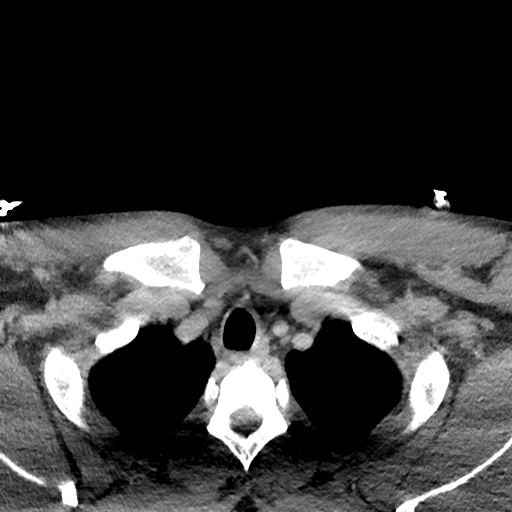
[im 30/149  bone]
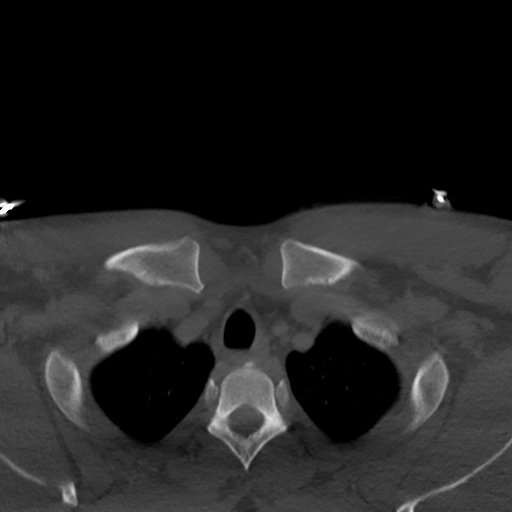
[im 60/149  bone]
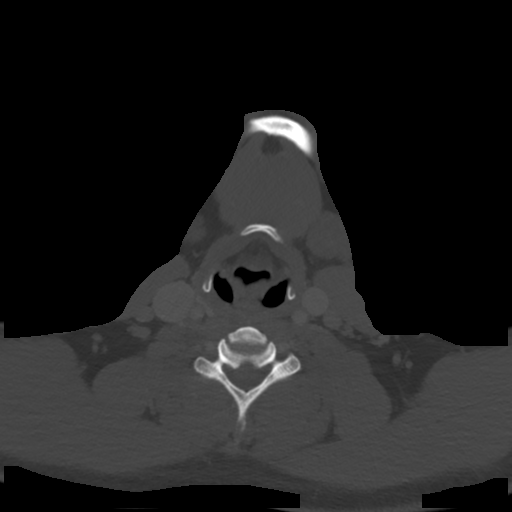
[im 89/149  bone]
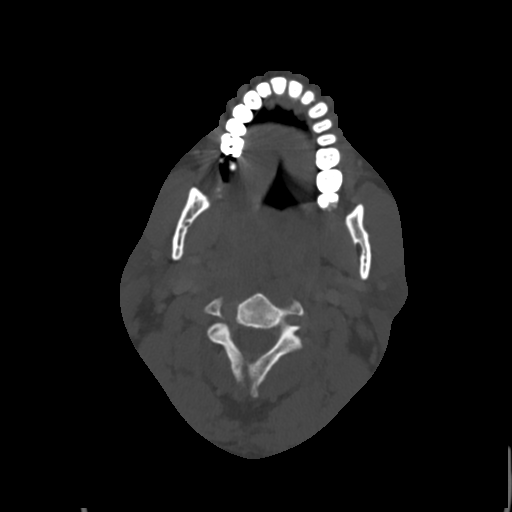
[im 119/149  bone]
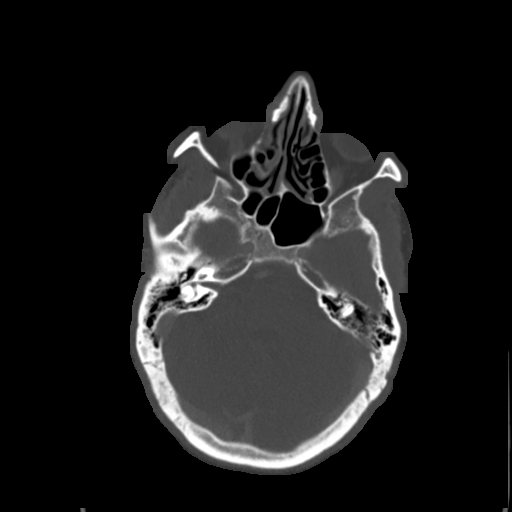

[Series 7: coronal st · coronal · 0.58mm/px · 3 of 117 slices shown]
[im 24/117  bone]
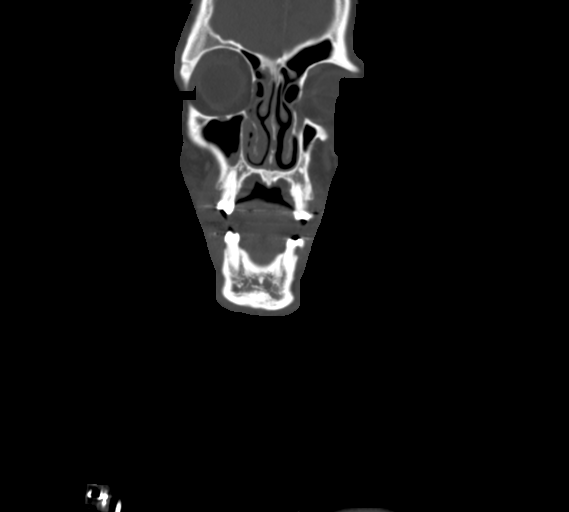
[im 47/117  bone]
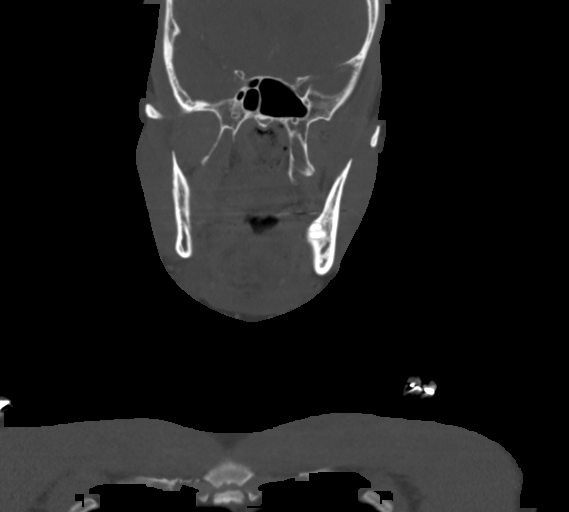
[im 70/117  bone]
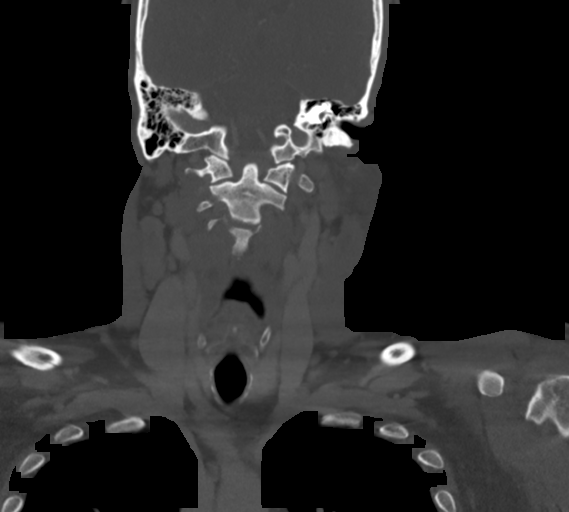

[Series 8: sagittal st · sagittal · 0.48mm/px · 5 of 101 slices shown, 6 images]
[im 34/101  bone]
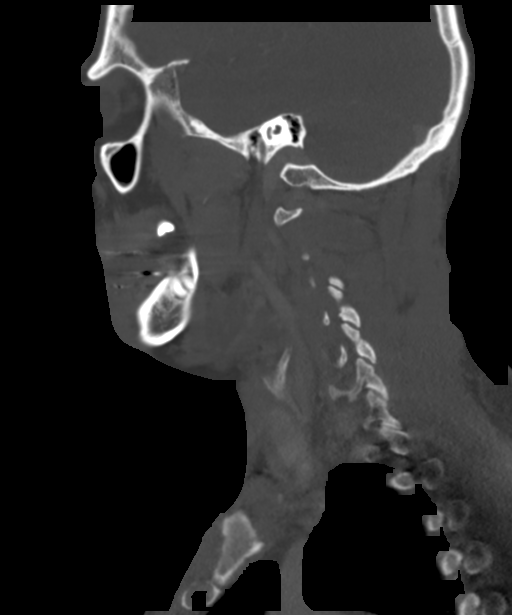
[im 42/101  bone]
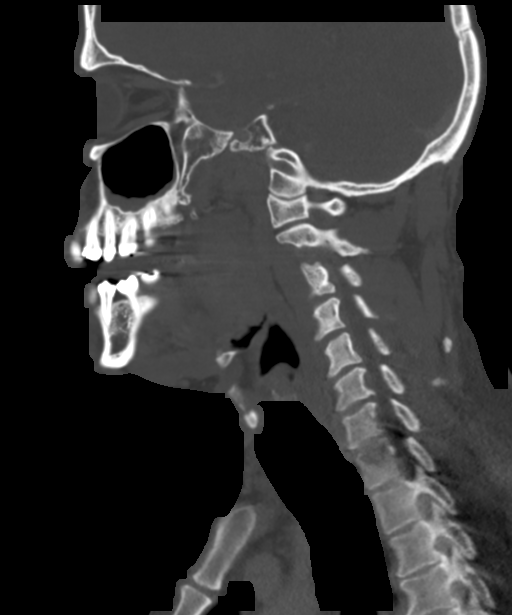
[im 51/101  soft-tissue]
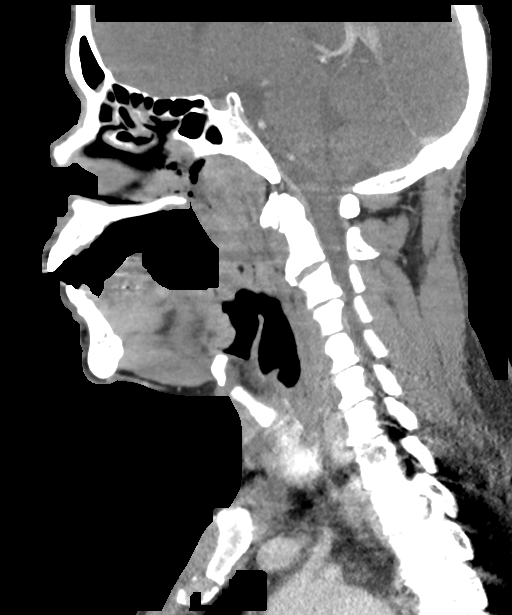
[im 51/101  bone]
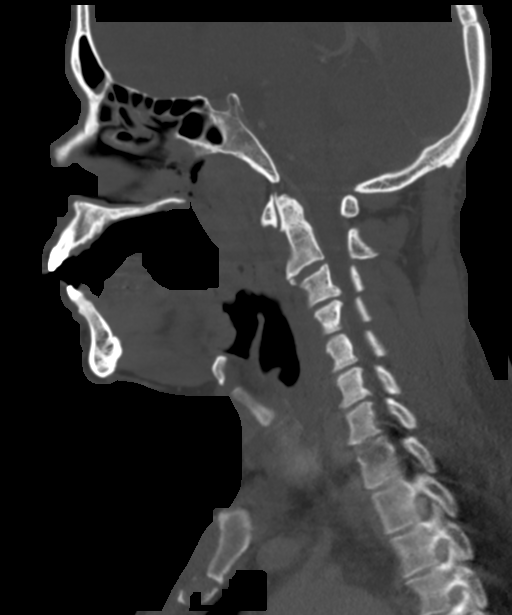
[im 59/101  bone]
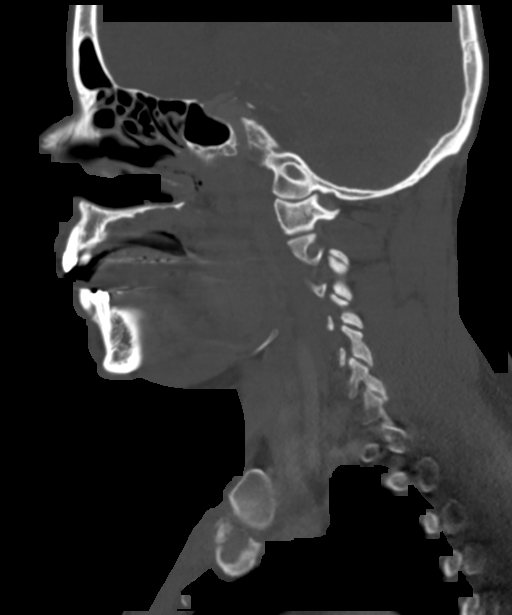
[im 67/101  bone]
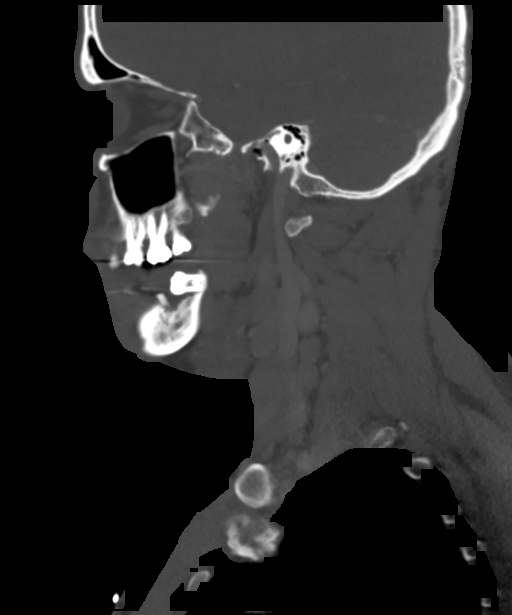

[Series 9: orthogonal st · axial · 0.39mm/px · z∈[-205,-147]mm · 2 of 147 slices shown]
[im 30/147  bone]
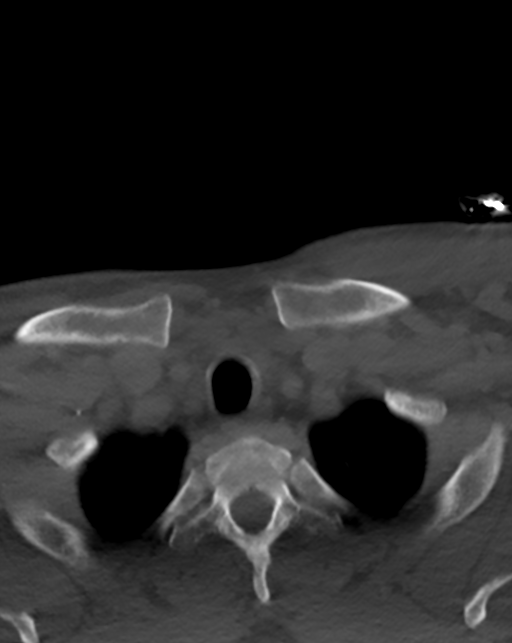
[im 59/147  bone]
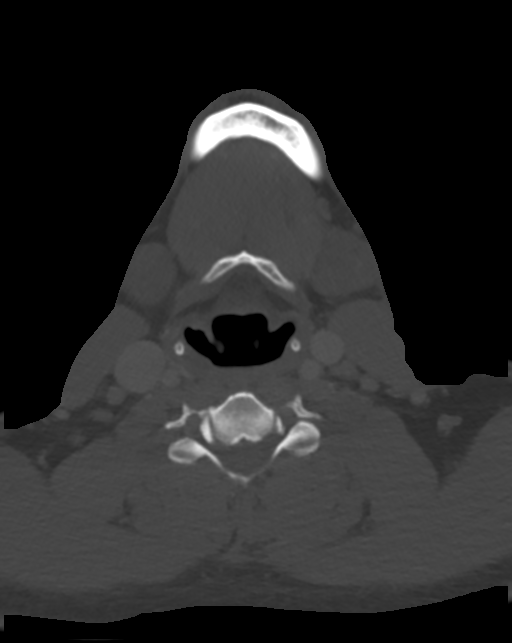

[14 of 33 positions shown; findings below may reference images not displayed]

FINDINGS: Pharynx and larynx: Streak and beam hardening artifact arising from
dental restoration partially obscures the oral cavity. There is
marked prominence of the bilateral adenoid soft tissues and palatine
tonsils. No peritonsillar abscess is identified. Mild to moderate
oropharyngeal airway effacement. The larynx is unremarkable.
Unremarkable appearance of the epiglottis. No retropharyngeal
collection.

Salivary glands: No inflammation, mass, or stone.

Thyroid: Unremarkable.

Lymph nodes: Bilateral level 2 and 3 lymphadenopathy with lymph
nodes measuring up to 16 mm in short axis.

Vascular: The major vascular structures of the neck are patent.

Limited intracranial: No evidence of acute intracranial abnormality
within the field of view.

Visualized orbits: No acute or significant orbital finding.

Mastoids and visualized paranasal sinuses: Mild mucosal thickening
within the right frontal, bilateral ethmoid, right sphenoid and
bilateral maxillary sinuses. No significant mastoid effusion.

Skeleton: Cervical spondylosis. No acute bony abnormality or
aggressive osseous lesion.

Upper chest: No consolidation within the imaged lung apices.
IMPRESSION: Marked prominence of the bilateral adenoid/nasopharyngeal soft
tissues and palatine tonsils. These findings may reflect acute
tonsillitis/pharyngitis in the appropriate clinical setting.
However, potential alternative etiologies include
tonsillar/pharyngeal mass or a lymphoproliferative process such as
lymphoma. Clinical correlation is recommended. Additionally, close
clinical follow-up is recommended (with imaging follow-up as
warranted) to ensure resolution. If there is not a prompt response
to therapy, ENT consultation and direct tissue sampling should be
considered. Mild-to-moderate oropharyngeal airway effacement. No
peritonsillar abscess is identified.

Bilateral level 2 and 3 lymphadenopathy, which may be reactive.
However, close clinical follow-up is recommended (with imaging
follow-up as warranted) to ensure resolution and exclude alternative
etiologies (such as nodal metastatic disease or a
lymphoproliferative process).

Paranasal sinus disease, as described.
